# Patient Record
Sex: Male | Born: 1960 | Race: White | Hispanic: No | Marital: Single | Smoking: Never smoker
Health system: Southern US, Community
[De-identification: ages and names within clinical notes are randomized; demographics above are authoritative.]

---

## 2019-08-31 ENCOUNTER — Other Ambulatory Visit: Payer: Self-pay

## 2019-08-31 ENCOUNTER — Encounter (HOSPITAL_COMMUNITY): Payer: Self-pay | Admitting: *Deleted

## 2019-08-31 ENCOUNTER — Emergency Department (HOSPITAL_COMMUNITY)
Admission: EM | Admit: 2019-08-31 | Discharge: 2019-08-31 | Disposition: A | Discharge status: Home / Self Care | Payer: 59 | Attending: Emergency Medicine | Admitting: Emergency Medicine

## 2019-08-31 ENCOUNTER — Emergency Department (HOSPITAL_COMMUNITY): Payer: 59

## 2019-08-31 DIAGNOSIS — R310 Gross hematuria: Secondary | ICD-10-CM | POA: Diagnosis not present

## 2019-08-31 DIAGNOSIS — Z8546 Personal history of malignant neoplasm of prostate: Secondary | ICD-10-CM | POA: Insufficient documentation

## 2019-08-31 DIAGNOSIS — R109 Unspecified abdominal pain: Secondary | ICD-10-CM | POA: Diagnosis present

## 2019-08-31 LAB — COMPREHENSIVE METABOLIC PANEL
ALT: 19 U/L (ref 0–44)
AST: 19 U/L (ref 15–41)
Albumin: 3.8 g/dL (ref 3.5–5.0)
Alkaline Phosphatase: 48 U/L (ref 38–126)
Anion gap: 8 (ref 5–15)
BUN: 18 mg/dL (ref 6–20)
CO2: 28 mmol/L (ref 22–32)
Calcium: 9.3 mg/dL (ref 8.9–10.3)
Chloride: 102 mmol/L (ref 98–111)
Creatinine, Ser: 1.09 mg/dL (ref 0.61–1.24)
GFR calc Af Amer: >60 mL/min (ref >60)
GFR calc non Af Amer: >60 mL/min (ref >60)
Glucose, Bld: 101 mg/dL — ABNORMAL HIGH (ref 70–99)
Potassium: 3.6 mmol/L (ref 3.5–5.1)
Sodium: 138 mmol/L (ref 135–145)
Total Bilirubin: 0.9 mg/dL (ref 0.3–1.2)
Total Protein: 5.9 g/dL — ABNORMAL LOW (ref 6.5–8.1)

## 2019-08-31 LAB — URINALYSIS, ROUTINE W REFLEX MICROSCOPIC
Bacteria, UA: NONE SEEN
Bilirubin Urine: NEGATIVE
Glucose, UA: NEGATIVE mg/dL
Ketones, ur: NEGATIVE mg/dL
Leukocytes,Ua: NEGATIVE
Nitrite: NEGATIVE
Protein, ur: NEGATIVE mg/dL
RBC / HPF: >50 RBC/hpf — ABNORMAL HIGH (ref 0–5)
Specific Gravity, Urine: 1.013 (ref 1.005–1.030)
pH: 7 (ref 5.0–8.0)

## 2019-08-31 LAB — CBC WITH DIFFERENTIAL/PLATELET
Abs Immature Granulocytes: 0.03 10*3/uL (ref 0.00–0.07)
Basophils Absolute: 0.1 10*3/uL (ref 0.0–0.1)
Basophils Relative: 1 %
Eosinophils Absolute: 0.1 10*3/uL (ref 0.0–0.5)
Eosinophils Relative: 1 %
HCT: 39.5 % (ref 39.0–52.0)
Hemoglobin: 13 g/dL (ref 13.0–17.0)
Immature Granulocytes: 0 %
Lymphocytes Relative: 25 %
Lymphs Abs: 2.3 10*3/uL (ref 0.7–4.0)
MCH: 30.4 pg (ref 26.0–34.0)
MCHC: 32.9 g/dL (ref 30.0–36.0)
MCV: 92.5 fL (ref 80.0–100.0)
Monocytes Absolute: 1.1 10*3/uL — ABNORMAL HIGH (ref 0.1–1.0)
Monocytes Relative: 12 %
Neutro Abs: 5.8 10*3/uL (ref 1.7–7.7)
Neutrophils Relative %: 61 %
Platelets: 232 10*3/uL (ref 150–400)
RBC: 4.27 MIL/uL (ref 4.22–5.81)
RDW: 12.9 % (ref 11.5–15.5)
WBC: 9.4 10*3/uL (ref 4.0–10.5)
nRBC: 0 % (ref 0.0–0.2)

## 2019-08-31 LAB — LIPASE, BLOOD: Lipase: 27 U/L (ref 11–51)

## 2019-08-31 NOTE — ED Triage Notes (Signed)
The pt is c/o lt flank pain since Sunday  Bloody urine  He had a treatment for prostate cancer Monday   No nausea vomiting

## 2019-08-31 NOTE — ED Provider Notes (Signed)
[facility] EMERGENCY DEPARTMENT Provider Note   CSN: [id number] Arrival date & time: 08/31/19  1734     History Chief Complaint  Patient presents with  . Flank Pain    Austin Todd is a 59 y.o. male.  The history is provided by the patient and medical records. No language interpreter was used.     59 year old male with history of prostate cancer currently receiving radiation presenting for evaluation of left flank pain.  Patient report 4 days ago he developed acute onset of pain to his left flank.  Described pain as a sharp, nonradiating, intense pain that is waxing waning, improves with laying and worsening with movement.  The next day he had a radiation procedure as treatment of his prostate but states the pain started prior to the procedure.  He was told that he would see blood in his urine and will have some urinary discomfort.  He has seen some blood in his urine.  He mention the pain has been increasingly more intense.  He denies associated fever chills nausea vomiting or diarrhea or dysuria.  He denies any rash.  No prior history of kidney stone.  Pain is mild at this time.  No past medical history on file.  There are no problems to display for this patient.   The histories are not reviewed yet. Please review them in the "History" navigator section and refresh this SmartLink.     No family history on file.  Social History   Tobacco Use  . Smoking status: Not on file  Substance Use Topics  . Alcohol use: Not on file  . Drug use: Not on file    Home Medications Prior to Admission medications   Not on File    Allergies    Patient has no allergy information on record.  Review of Systems   Review of Systems  All other systems reviewed and are negative.   Physical Exam Updated Vital Signs BP (!) 143/91 (BP Location: Left Arm)   Pulse (!) 56   Temp 98.2 F (36.8 C) (Oral)   Resp 14   Ht 5\' 11"  (1.803 m)   Wt 77.6 kg   SpO2 97%    BMI 23.85 kg/m   Physical Exam Vitals and nursing note reviewed.  Constitutional:      General: He is not in acute distress.    Appearance: He is well-developed.  HENT:     Head: Atraumatic.  Eyes:     Conjunctiva/sclera: Conjunctivae normal.  Cardiovascular:     Rate and Rhythm: Normal rate and regular rhythm.     Pulses: Normal pulses.     Heart sounds: Normal heart sounds.  Pulmonary:     Effort: Pulmonary effort is normal.     Breath sounds: Normal breath sounds.  Abdominal:     General: Abdomen is flat.     Palpations: Abdomen is soft.     Tenderness: There is no abdominal tenderness. There is left CVA tenderness. There is no right CVA tenderness.  Musculoskeletal:     Cervical back: Neck supple.  Skin:    Findings: No rash.  Neurological:     Mental Status: He is alert and oriented to person, place, and time.  Psychiatric:        Mood and Affect: Mood normal.     ED Results / Procedures / Treatments   Labs (all labs ordered are listed, but only abnormal results are displayed) Labs Reviewed  CBC WITH DIFFERENTIAL/PLATELET -  Abnormal; Notable for the following components:      Result Value   Monocytes Absolute 1.1 (*)    All other components within normal limits  COMPREHENSIVE METABOLIC PANEL - Abnormal; Notable for the following components:   Glucose, Bld 101 (*)    Total Protein 5.9 (*)    All other components within normal limits  LIPASE, BLOOD  URINALYSIS, ROUTINE W REFLEX MICROSCOPIC    EKG None  Radiology CT Renal Stone Study  Result Date: 08/31/2019 CLINICAL DATA:  Left-sided flank pain with blood in the urine recent treatment for prostate cancer EXAM: CT ABDOMEN AND PELVIS WITHOUT CONTRAST TECHNIQUE: Multidetector CT imaging of the abdomen and pelvis was performed following the standard protocol without IV contrast. COMPARISON:  None. FINDINGS: Lower chest: No acute abnormality. Hepatobiliary: Multiple calcified gallstones. No focal hepatic  abnormality. No biliary dilatation Pancreas: Unremarkable. No pancreatic ductal dilatation or surrounding inflammatory changes. Spleen: Normal in size without focal abnormality. Adrenals/Urinary Tract: Adrenal glands are normal. No hydronephrosis or ureteral stone. Small amount of air within the bladder. Stomach/Bowel: Stomach is within normal limits. Appendix not well seen but no right lower quadrant inflammatory process. No evidence of bowel wall thickening, distention, or inflammatory changes. Vascular/Lymphatic: No significant vascular findings are present. No enlarged abdominal or pelvic lymph nodes. Reproductive: Prostate slightly enlarged with calcification. Mild haziness about the prostate and seminal vesicles. Other: Negative for free air or free fluid. Musculoskeletal: No acute or significant osseous findings. IMPRESSION: 1. Negative for hydronephrosis or ureteral stone. Small amount of air in the urinary bladder, correlate for any recent instrumentation. 2. Mild inflammatory changes about the prostate gland and seminal vesicles. 3. Gallstones Electronically Signed   By: Jasmine Pang M.D.   On: 08/31/2019 19:17    Procedures Procedures (including critical care time)  Medications Ordered in ED Medications - No data to display  ED Course  I have reviewed the triage vital signs and the nursing notes.  Pertinent labs & imaging results that were available during my care of the patient were reviewed by me and considered in my medical decision making (see chart for details).    MDM Rules/Calculators/A&P                          BP (!) 143/91 (BP Location: Left Arm)   Pulse (!) 56   Temp 98.2 F (36.8 C) (Oral)   Resp 14   Ht 5\' 11"  (1.803 m)   Wt 77.6 kg   SpO2 97%   BMI 23.86 kg/m   Final Clinical Impression(s) / ED Diagnoses Final diagnoses:  Left flank pain  Gross hematuria    Rx / DC Orders ED Discharge Orders    None     5:50 PM Patient with known history of prostate  cancer recently started on chemo/radiation who is here with acute onset of pain to his left lateral abdomen/flank for the past few days.  No significant CVA tenderness but he does have reproducible pain to the left lateral lower abdomen on palpation.  No overlying skin changes.  Will obtain CT scan for further evaluation.  Pain medication offered but patient declined.  7:26 PM Normal WBC, normal H&H, electrolyte panels are reassuring, normal lipase, CT renal stone study demonstrated no evidence of hydronephrosis or ureteral stone.  Small amount of air in the urinary bladder.  Mild amatory changes about the prostate gland and seminal vesicles.  Evidence of gallstones.  UA shows large amounts  of hemoglobin and urine dipsticks but no signs of infection.  7:36 PM I discussed finding with patient.  At this time, pain could possibly be early signs of shingles, or he could be muscle skeletal pain.  I did mention to the patient that the CT scan is without contrast.  If his pain persist he may need to return for another CT scan with contrast although my suspicion for infectious or malignant spread is low at this time.  Encourage patient to take over-the-counter Tylenol or ibuprofen as needed for pain and to follow-up with his doctor for further care.  Patient request for a CD copy of his CT scan.  Will request nurse to obtain one for him.  I also offered pain medication but patient declined.   Fayrene Helper, PA-C 08/31/19 1945    Benjiman Core, MD 08/31/19 2232

## 2019-08-31 NOTE — Discharge Instructions (Addendum)
You have been evaluated for your left flank pain.  Fortunately your CT scan did not show any concerning feature.  There is some air noted in your bladder as well as inflammatory changes about your prostate which is likely due to recent procedures several days ago.  Take ibuprofen or Tylenol as needed for pain.  Follow-up closely with your doctor for further care.  Return promptly if your symptoms worsen or if you have any other concern.

## 2021-08-24 IMAGING — CT CT RENAL STONE PROTOCOL
2 of 4 series · 16 of 46 positions shown, 18 images · non-contrast
Comparison: None.

CLINICAL DATA: Left-sided flank pain with blood in the urine recent
treatment for prostate cancer

EXAM:
CT ABDOMEN AND PELVIS WITHOUT CONTRAST
TECHNIQUE: Multidetector CT imaging of the abdomen and pelvis was performed
following the standard protocol without IV contrast.

[Series 3: renal stone 5.0 · axial · 0.86mm/px · z∈[-535,-65]mm · 13 of 102 slices shown, 15 images]
[im 4/102  soft-tissue]
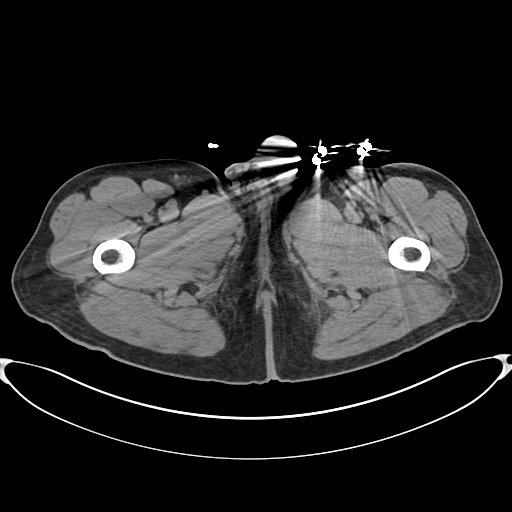
[im 4/102  bone]
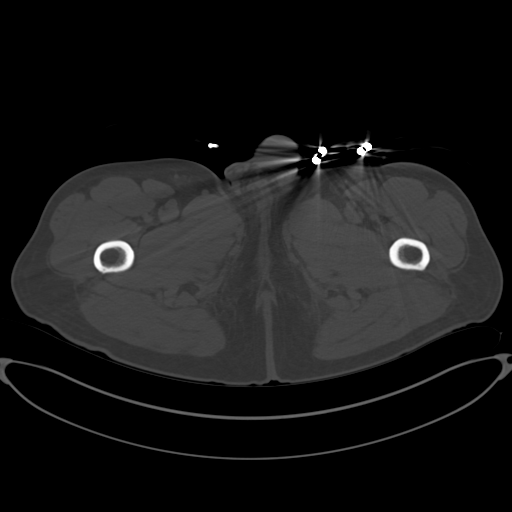
[im 12/102  soft-tissue]
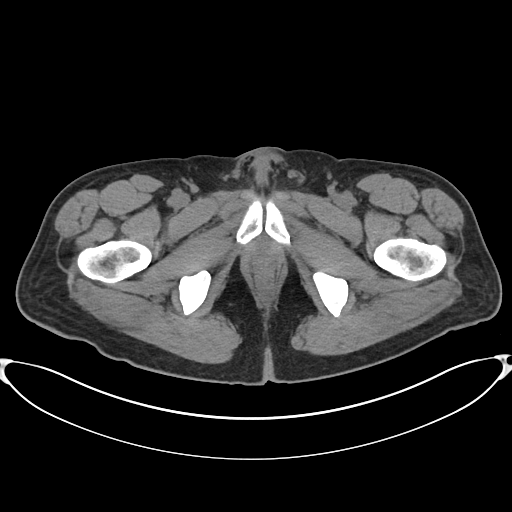
[im 20/102  soft-tissue]
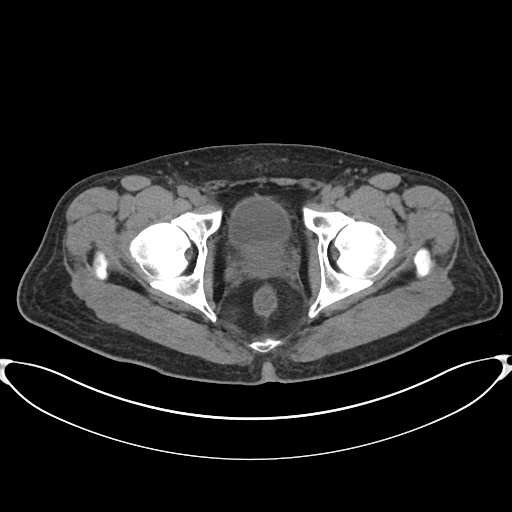
[im 28/102  soft-tissue]
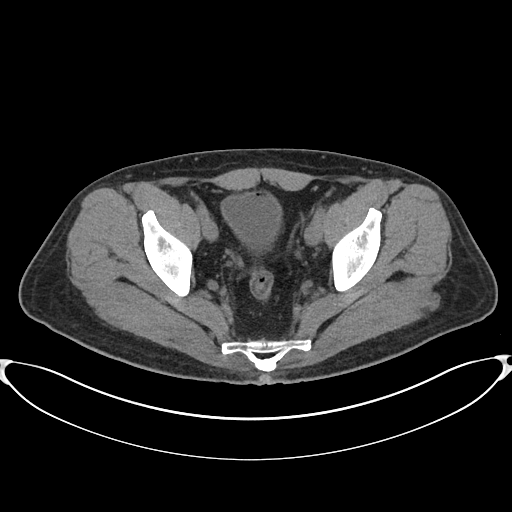
[im 35/102  soft-tissue]
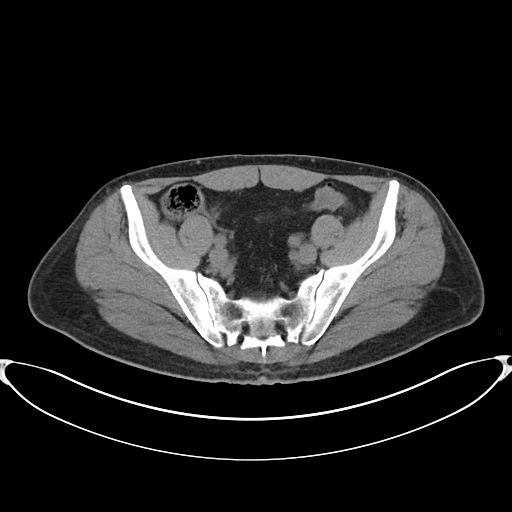
[im 43/102  soft-tissue]
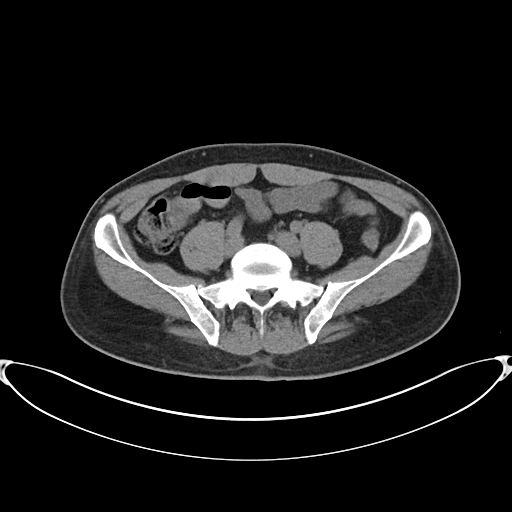
[im 51/102  soft-tissue]
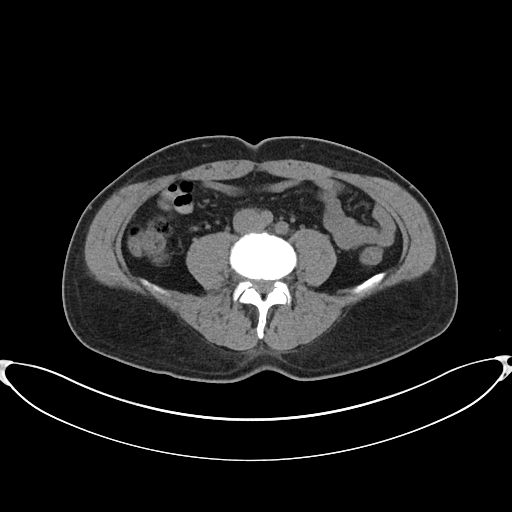
[im 59/102  soft-tissue]
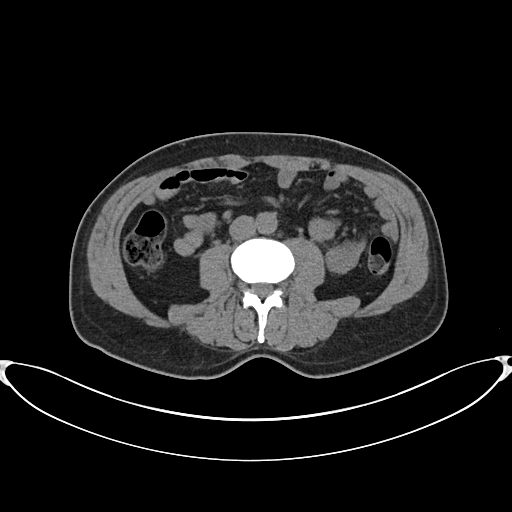
[im 67/102  soft-tissue]
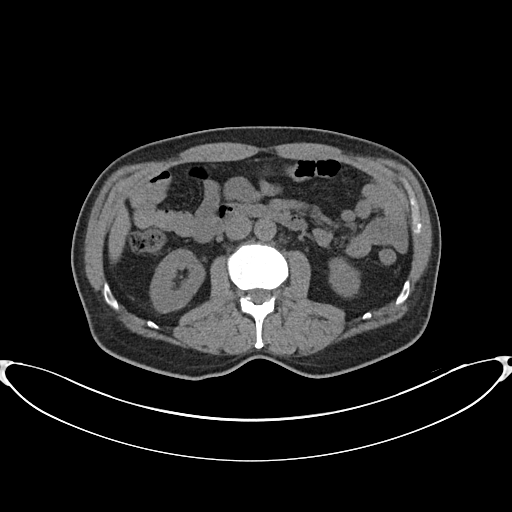
[im 67/102  bone]
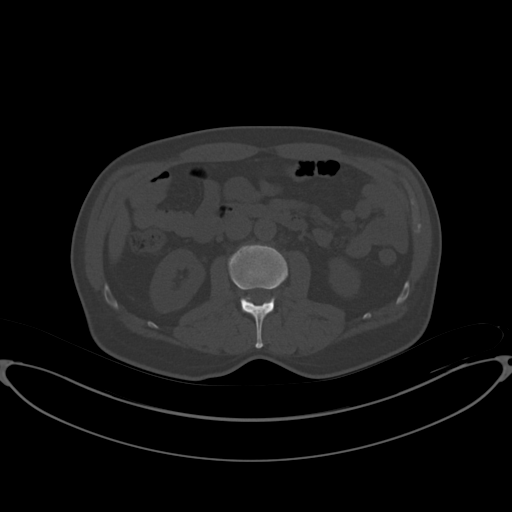
[im 74/102  soft-tissue]
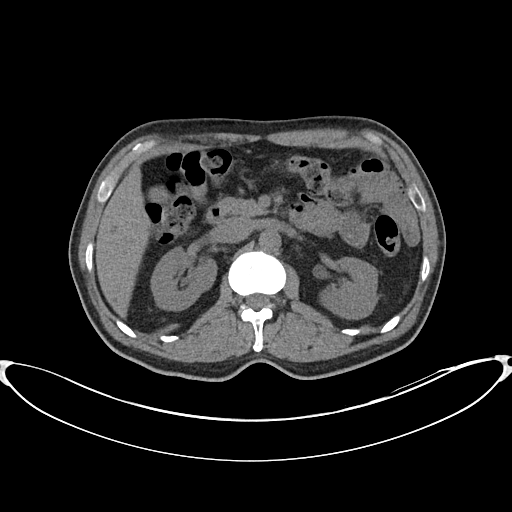
[im 82/102  soft-tissue]
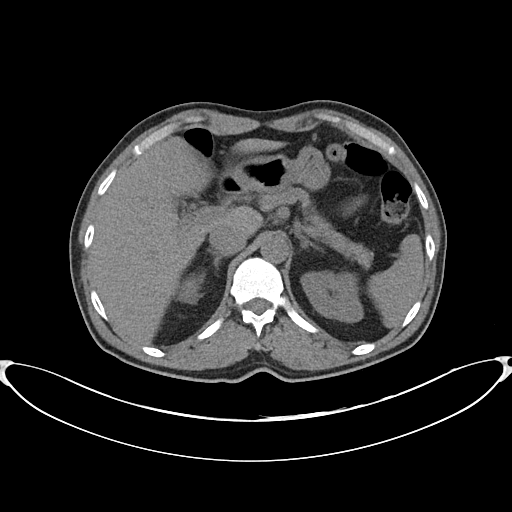
[im 90/102  soft-tissue]
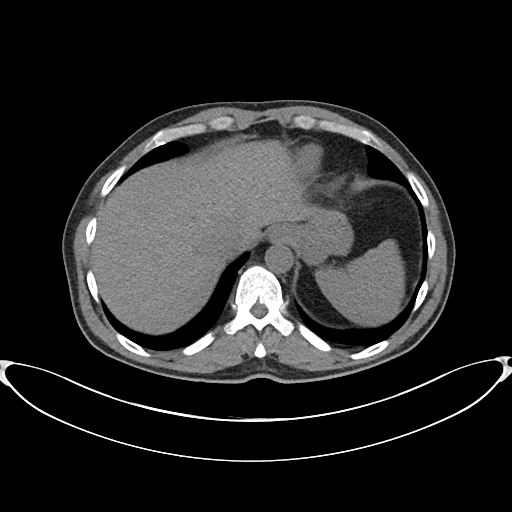
[im 98/102  soft-tissue]
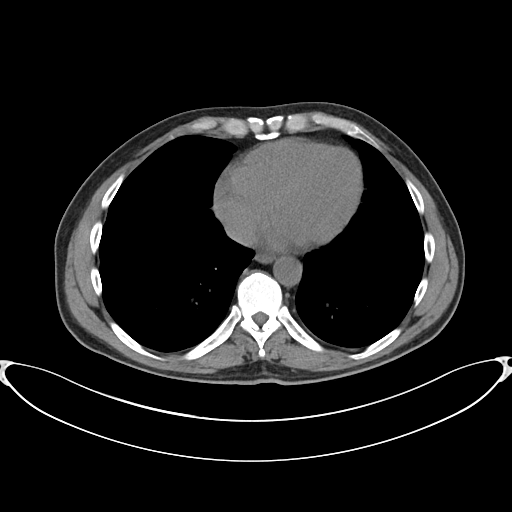

[Series 5: coronal · coronal · 0.79mm/px · 3 of 88 slices shown]
[im 30/88  soft-tissue]
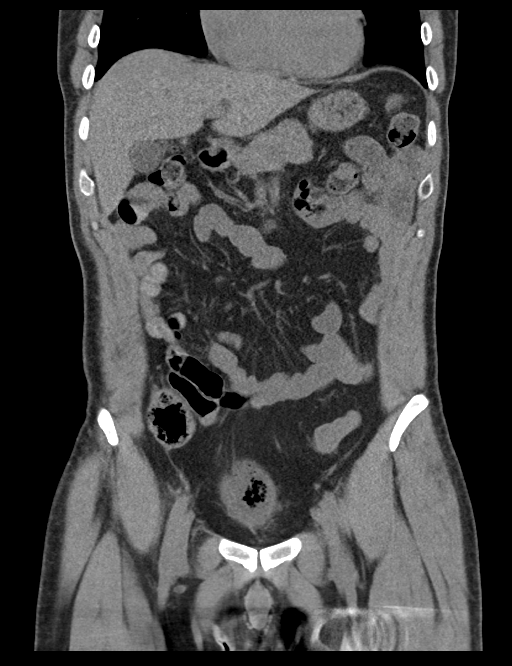
[im 39/88  soft-tissue]
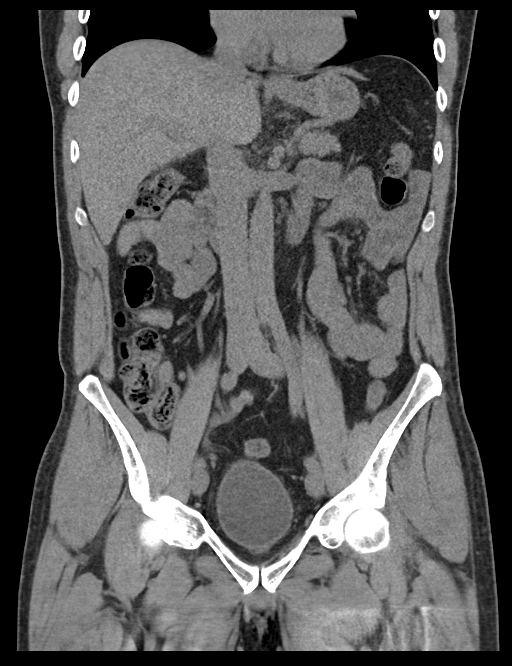
[im 49/88  soft-tissue]
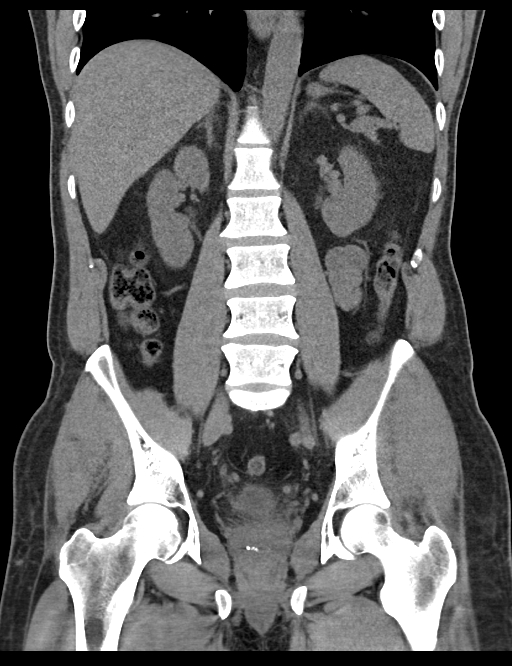

[16 of 46 positions shown; findings below may reference images not displayed]

FINDINGS: Lower chest: No acute abnormality.

Hepatobiliary: Multiple calcified gallstones. No focal hepatic
abnormality. No biliary dilatation

Pancreas: Unremarkable. No pancreatic ductal dilatation or
surrounding inflammatory changes.

Spleen: Normal in size without focal abnormality.

Adrenals/Urinary Tract: Adrenal glands are normal. No hydronephrosis
or ureteral stone. Small amount of air within the bladder.

Stomach/Bowel: Stomach is within normal limits. Appendix not well
seen but no right lower quadrant inflammatory process. No evidence
of bowel wall thickening, distention, or inflammatory changes.

Vascular/Lymphatic: No significant vascular findings are present. No
enlarged abdominal or pelvic lymph nodes.

Reproductive: Prostate slightly enlarged with calcification. Mild
haziness about the prostate and seminal vesicles.

Other: Negative for free air or free fluid.

Musculoskeletal: No acute or significant osseous findings.
IMPRESSION: 1. Negative for hydronephrosis or ureteral stone. Small amount of
air in the urinary bladder, correlate for any recent
instrumentation.
2. Mild inflammatory changes about the prostate gland and seminal
vesicles.
3. Gallstones
